# Patient Record
Sex: Female | Born: 1995 | Hispanic: Refuse to answer | Marital: Single | Smoking: Never smoker
Health system: Southern US, Community
[De-identification: ages and names within clinical notes are randomized; demographics above are authoritative.]

## PROBLEM LIST (undated history)

## (undated) DIAGNOSIS — F913 Oppositional defiant disorder: Secondary | ICD-10-CM

## (undated) DIAGNOSIS — F429 Obsessive-compulsive disorder, unspecified: Secondary | ICD-10-CM

## (undated) DIAGNOSIS — H9192 Unspecified hearing loss, left ear: Secondary | ICD-10-CM

## (undated) DIAGNOSIS — F319 Bipolar disorder, unspecified: Secondary | ICD-10-CM

## (undated) DIAGNOSIS — F23 Brief psychotic disorder: Secondary | ICD-10-CM

## (undated) HISTORY — PX: INNER EAR SURGERY: SHX679

## (undated) HISTORY — PX: TONSILLECTOMY AND ADENOIDECTOMY: SUR1326

---
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
## 2018-04-18 ENCOUNTER — Other Ambulatory Visit: Payer: Self-pay

## 2018-04-18 ENCOUNTER — Ambulatory Visit
Admission: EM | Admit: 2018-04-18 | Discharge: 2018-04-18 | Disposition: A | Discharge status: Home / Self Care | Payer: BLUE CROSS/BLUE SHIELD | Attending: Family Medicine | Admitting: Family Medicine

## 2018-04-18 ENCOUNTER — Encounter: Payer: Self-pay | Admitting: Emergency Medicine

## 2018-04-18 DIAGNOSIS — S81811A Laceration without foreign body, right lower leg, initial encounter: Secondary | ICD-10-CM | POA: Diagnosis not present

## 2018-04-18 DIAGNOSIS — W0110XA Fall on same level from slipping, tripping and stumbling with subsequent striking against unspecified object, initial encounter: Secondary | ICD-10-CM | POA: Diagnosis not present

## 2018-04-18 DIAGNOSIS — L03116 Cellulitis of left lower limb: Secondary | ICD-10-CM | POA: Diagnosis not present

## 2018-04-18 DIAGNOSIS — Z23 Encounter for immunization: Secondary | ICD-10-CM | POA: Diagnosis not present

## 2018-04-18 MED ORDER — CEPHALEXIN 500 MG PO CAPS: 500.0000 mg | ORAL_CAPSULE | Freq: Four times a day (QID) | ORAL | 0 refills | Status: AC

## 2018-04-18 MED ORDER — TETANUS-DIPHTH-ACELL PERTUSSIS 5-2.5-18.5 LF-MCG/0.5 IM SUSP
0.5000 mL | Freq: Once | INTRAMUSCULAR | Status: AC
  Administered 2018-04-18: 16:00:00 0.5 mL via INTRAMUSCULAR

## 2018-04-18 NOTE — Discharge Instructions (Signed)
-  Take antibiotics as prescribed. -If redness worsens return to Urgent Care or proceed to ER.

## 2018-04-18 NOTE — ED Provider Notes (Signed)
[facility]    CSN: [id number] Arrival date & time: 04/18/18  1542     History   Chief Complaint Chief Complaint  Patient presents with  . Puncture Wound  . Cellulitis   HPI Melissa Burns is a 23 y.o. female who presents today for evaluation of right leg swelling and redness.  The patient states that on Tuesday she was walking her dog in the woods when she tripped and fell.  She reports cutting her right leg just proximal to her knee, she is not sure if the skin was punctured, she is not sure of any foreign bodies.  She cannot recall if it was wood or a piece of glass that cut the leg.  She reported mild discomfort however over the next 24 hours she noticed increased redness.  She has not noticed any purulent drainage.  She has not had any fevers at home.  She denies any redness extending down distal in the left leg.  Denies any drug allergies.  HPI  Past Medical History:  Diagnosis Date  . Bipolar 1 disorder (HCC)   . Deafness in left ear   . OCD (obsessive compulsive disorder)   . ODD (oppositional defiant disorder)   . Psychotic episode [facility])     Patient Active Problem List   Diagnosis Date Noted  . Suicide attempt (HCC) 05/25/2014  . Overdose 05/23/2014  . Cluster B personality disorder (HCC) 05/23/2014  . Grief 05/23/2014    Past Surgical History:  Procedure Laterality Date  . INNER EAR SURGERY Left   . TONSILLECTOMY AND ADENOIDECTOMY      OB History   No obstetric history on file.    Home Medications    Prior to Admission medications   Medication Sig Start Date End Date Taking? Authorizing Provider  FLUoxetine (PROZAC) 20 MG tablet Take 60 mg by mouth daily.   Yes [provider]  methylphenidate (RITALIN) 20 MG tablet Take 20 mg by mouth 2 (two) times daily.   Yes [provider]  SPRINTEC 28 0.25-35 MG-MCG tablet Take 1 tablet by mouth daily. 04/08/18  Yes [provider]  cephALEXin (KEFLEX) 500 MG capsule Take  1 capsule (500 mg total) by mouth 4 (four) times daily for 7 days. 04/18/18 04/25/18  Tommie Sams, DO  loratadine (CLARITIN) 10 MG tablet Take 1 tablet (10 mg total) by mouth daily. 10/06/14   Renford Dills, NP  nitrofurantoin, macrocrystal-monohydrate, (MACROBID) 100 MG capsule Take 1 capsule (100 mg total) by mouth 2 (two) times daily. 08/03/14   Betancourt, Jarold Song, NP  ondansetron (ZOFRAN ODT) 4 MG disintegrating tablet Take 1 tablet (4 mg total) by mouth every 8 (eight) hours as needed for nausea or vomiting. 12/22/14   Renford Dills, NP  phenazopyridine (PYRIDIUM) 200 MG tablet Take 1 tablet (200 mg total) by mouth 3 (three) times daily. 08/03/14   Betancourt, Jarold Song, NP    Family History Family History  Problem Relation Age of Onset  . Diabetes Maternal Grandfather   . Suicidality Paternal Uncle     Social History Social History   Tobacco Use  . Smoking status: Never Smoker  . Smokeless tobacco: Never Used  Substance Use Topics  . Alcohol use: No    Alcohol/week: 0.0 standard drinks  . Drug use: No    Comment: denied by pt's mother     Allergies   Patient has no known allergies.   Review of Systems Review of Systems  Constitutional: Negative  for fever.  Skin: Positive for color change and wound.  All other systems reviewed and are negative.  Physical Exam Triage Vital Signs ED Triage Vitals  Enc Vitals Group     BP 04/18/18 1555 101/69     Pulse Rate 04/18/18 1555 99     Resp 04/18/18 1555 16     Temp 04/18/18 1555 98.4 F (36.9 C)     Temp Source 04/18/18 1555 Oral     SpO2 04/18/18 1555 98 %     Weight 04/18/18 1552 150 lb (68 kg)     Height 04/18/18 1552  (1.575 m)     Head Circumference --      Peak Flow --      Pain Score 04/18/18 1552 8     Pain Loc --      Pain Edu? --      Excl. in GC? --    No data found.  Updated Vital Signs BP 101/69 (BP Location: Left Arm)   Pulse 99   Temp 98.4 F (36.9 C) (Oral)   Resp 16   Ht  (1.575 m)    Wt 150 lb (68 kg)   LMP 03/28/2018 (Approximate)   SpO2 98%   BMI 27.44 kg/m   Visual Acuity Right Eye Distance:   Left Eye Distance:   Bilateral Distance:    Right Eye Near:   Left Eye Near:    Bilateral Near:     Physical Exam Examination of the left leg demonstrates a approximately 1 cm laceration with good granulation tissue along the lateral aspect of the distal thigh proximal to the knee.  Surrounding this area is a 3 inch x 2 inch circumferential area of skin induration and erythema.  No purulent drainage.  Palpation of the leg does not demonstrate any abscess formation.  No foreign body can be palpated.  She is able to fully extend the left knee and flex left knee without any discomfort.  No evidence of septic arthritis.  Intact to light touch of the left lower extremity.  UC Treatments / Results  Labs (all labs ordered are listed, but only abnormal results are displayed) Labs Reviewed - No data to display  EKG None  Radiology No results found.  Procedures Procedures (including critical care time)  Medications Ordered in UC Medications  Tdap (BOOSTRIX) injection 0.5 mL (0.5 mLs Intramuscular Given 04/18/18 1603)    Initial Impression / Assessment and Plan / UC Course  I have reviewed the triage vital signs and the nursing notes.  Pertinent labs & imaging results that were available during my care of the patient were reviewed by me and considered in my medical decision making (see chart for details).     1.  Treatment options were discussed today with the patient. 2.  Skin examination left leg indicative of nonpurulent cellulitis. 3.  The patient was started on a seven-day course of Keflex.  A marker outline the area of erythema, she was instructed to monitor for decrease in size. 4.  If symptoms worsen or redness increases follow-up with urgent care. Final Clinical Impressions(s) / UC Diagnoses   Final diagnoses:  Cellulitis of left lower extremity      Discharge Instructions     -Take antibiotics as prescribed. -If redness worsens return to Urgent Care or proceed to ER.    ED Prescriptions    Medication Sig Dispense Auth. Provider   cephALEXin (KEFLEX) 500 MG capsule Take 1 capsule (500 mg total)  by mouth 4 (four) times daily for 7 days. 28 capsule Tommie Sams, DO     Controlled Substance Prescriptions [place] Controlled Substance Registry consulted? Not Applicable   Anson [place], PA-C 04/18/18 1621

## 2018-04-18 NOTE — ED Triage Notes (Signed)
Patient states that she was out in the woods on Tuesday and something punctured her left thigh.  Patient c/o redness, swelling and pain at the site.

## 2018-10-15 ENCOUNTER — Emergency Department: Payer: BC Managed Care – PPO

## 2018-10-15 ENCOUNTER — Other Ambulatory Visit: Payer: Self-pay

## 2018-10-15 ENCOUNTER — Emergency Department
Admission: EM | Admit: 2018-10-15 | Discharge: 2018-10-15 | Disposition: A | Discharge status: Home / Self Care | Payer: BC Managed Care – PPO | Attending: Emergency Medicine | Admitting: Emergency Medicine

## 2018-10-15 DIAGNOSIS — Z20828 Contact with and (suspected) exposure to other viral communicable diseases: Secondary | ICD-10-CM | POA: Insufficient documentation

## 2018-10-15 DIAGNOSIS — Z79899 Other long term (current) drug therapy: Secondary | ICD-10-CM | POA: Insufficient documentation

## 2018-10-15 DIAGNOSIS — K529 Noninfective gastroenteritis and colitis, unspecified: Secondary | ICD-10-CM | POA: Diagnosis not present

## 2018-10-15 DIAGNOSIS — I959 Hypotension, unspecified: Secondary | ICD-10-CM | POA: Diagnosis not present

## 2018-10-15 DIAGNOSIS — R55 Syncope and collapse: Secondary | ICD-10-CM | POA: Insufficient documentation

## 2018-10-15 DIAGNOSIS — R05 Cough: Secondary | ICD-10-CM | POA: Diagnosis not present

## 2018-10-15 DIAGNOSIS — J029 Acute pharyngitis, unspecified: Secondary | ICD-10-CM | POA: Diagnosis not present

## 2018-10-15 DIAGNOSIS — R0602 Shortness of breath: Secondary | ICD-10-CM | POA: Diagnosis not present

## 2018-10-15 DIAGNOSIS — R079 Chest pain, unspecified: Secondary | ICD-10-CM | POA: Diagnosis not present

## 2018-10-15 DIAGNOSIS — R112 Nausea with vomiting, unspecified: Secondary | ICD-10-CM | POA: Diagnosis present

## 2018-10-15 LAB — ETHANOL: Alcohol, Ethyl (B): <10 mg/dL (ref <10)

## 2018-10-15 LAB — COMPREHENSIVE METABOLIC PANEL
ALT: 16 U/L (ref 0–44)
AST: 19 U/L (ref 15–41)
Albumin: 4 g/dL (ref 3.5–5.0)
Alkaline Phosphatase: 64 U/L (ref 38–126)
Anion gap: 12 (ref 5–15)
BUN: 10 mg/dL (ref 6–20)
CO2: 25 mmol/L (ref 22–32)
Calcium: 8.9 mg/dL (ref 8.9–10.3)
Chloride: 103 mmol/L (ref 98–111)
Creatinine, Ser: 0.57 mg/dL (ref 0.44–1.00)
GFR calc Af Amer: >60 mL/min (ref >60)
GFR calc non Af Amer: >60 mL/min (ref >60)
Glucose, Bld: 186 mg/dL — ABNORMAL HIGH (ref 70–99)
Potassium: 3.8 mmol/L (ref 3.5–5.1)
Sodium: 140 mmol/L (ref 135–145)
Total Bilirubin: 0.9 mg/dL (ref 0.3–1.2)
Total Protein: 6.7 g/dL (ref 6.5–8.1)

## 2018-10-15 LAB — URINALYSIS, COMPLETE (UACMP) WITH MICROSCOPIC
Bacteria, UA: NONE SEEN
Bilirubin Urine: NEGATIVE
Glucose, UA: 50 mg/dL — AB
Hgb urine dipstick: NEGATIVE
Ketones, ur: NEGATIVE mg/dL
Leukocytes,Ua: NEGATIVE
Nitrite: NEGATIVE
Protein, ur: NEGATIVE mg/dL
Specific Gravity, Urine: 1.013 (ref 1.005–1.030)
pH: 8 (ref 5.0–8.0)

## 2018-10-15 LAB — CBC WITH DIFFERENTIAL/PLATELET
Abs Immature Granulocytes: 0.06 10*3/uL (ref 0.00–0.07)
Basophils Absolute: 0 10*3/uL (ref 0.0–0.1)
Basophils Relative: 0 %
Eosinophils Absolute: 0 10*3/uL (ref 0.0–0.5)
Eosinophils Relative: 0 %
HCT: 38.3 % (ref 36.0–46.0)
Hemoglobin: 13.2 g/dL (ref 12.0–15.0)
Immature Granulocytes: 0 %
Lymphocytes Relative: 5 %
Lymphs Abs: 0.9 10*3/uL (ref 0.7–4.0)
MCH: 31.1 pg (ref 26.0–34.0)
MCHC: 34.5 g/dL (ref 30.0–36.0)
MCV: 90.1 fL (ref 80.0–100.0)
Monocytes Absolute: 0.2 10*3/uL (ref 0.1–1.0)
Monocytes Relative: 1 %
Neutro Abs: 16 10*3/uL — ABNORMAL HIGH (ref 1.7–7.7)
Neutrophils Relative %: 94 %
Platelets: 288 10*3/uL (ref 150–400)
RBC: 4.25 MIL/uL (ref 3.87–5.11)
RDW: 12.1 % (ref 11.5–15.5)
WBC: 17.1 10*3/uL — ABNORMAL HIGH (ref 4.0–10.5)
nRBC: 0 % (ref 0.0–0.2)

## 2018-10-15 LAB — SARS CORONAVIRUS 2 BY RT PCR (HOSPITAL ORDER, PERFORMED IN ~~LOC~~ HOSPITAL LAB): SARS Coronavirus 2: NEGATIVE

## 2018-10-15 LAB — PREGNANCY, URINE: Preg Test, Ur: NEGATIVE

## 2018-10-15 LAB — LIPASE, BLOOD: Lipase: 15 U/L (ref 11–51)

## 2018-10-15 MED ORDER — PROMETHAZINE HCL 25 MG/ML IJ SOLN
25.0000 mg | Freq: Once | INTRAMUSCULAR | Status: AC
  Administered 2018-10-15: 25 mg via INTRAVENOUS
  Filled 2018-10-15: qty 1

## 2018-10-15 MED ORDER — SODIUM CHLORIDE 0.9 % IV BOLUS
1000.0000 mL | Freq: Once | INTRAVENOUS | Status: AC
  Administered 2018-10-15: 07:00:00 1000 mL via INTRAVENOUS

## 2018-10-15 MED ORDER — ONDANSETRON HCL 4 MG/2ML IJ SOLN
4.0000 mg | Freq: Once | INTRAMUSCULAR | Status: AC
  Administered 2018-10-15: 4 mg via INTRAVENOUS
  Filled 2018-10-15: qty 2

## 2018-10-15 MED ORDER — SODIUM CHLORIDE 0.9 % IV BOLUS
1000.0000 mL | Freq: Once | INTRAVENOUS | Status: AC
  Administered 2018-10-15: 1000 mL via INTRAVENOUS

## 2018-10-15 MED ORDER — ONDANSETRON 4 MG PO TBDP: 4.0000 mg | ORAL_TABLET | Freq: Three times a day (TID) | ORAL | 0 refills | Status: DC | PRN

## 2018-10-15 MED ORDER — KETOROLAC TROMETHAMINE 30 MG/ML IJ SOLN
30.0000 mg | Freq: Once | INTRAMUSCULAR | Status: AC
  Administered 2018-10-15: 08:00:00 30 mg via INTRAVENOUS
  Filled 2018-10-15: qty 1

## 2018-10-15 MED ORDER — ALUM & MAG HYDROXIDE-SIMETH 200-200-20 MG/5ML PO SUSP
30.0000 mL | Freq: Once | ORAL | Status: AC
  Administered 2018-10-15: 08:00:00 30 mL via ORAL
  Filled 2018-10-15: qty 30

## 2018-10-15 NOTE — ED Notes (Signed)
Pt's IV site noted to be positional, spoke with patient regarding changing IV site to allow for easier fluid administration, pt in agreement to change IV site.

## 2018-10-15 NOTE — ED Notes (Signed)
MD made aware pt's BP down to 79 systolic. Pt repositioned in bed and BP retook, BP 84 systolic. Fluids placed on a pump, pt instructed to continue to lie on her back. Pt asymptomatic at this time. Will continue to monitor. MD aware of patient's drop in BP.

## 2018-10-15 NOTE — ED Notes (Addendum)
Pt noted to become hypotensive again, pt repositioned on her back, BP normotensive after repositioning. MD aware.

## 2018-10-15 NOTE — ED Notes (Signed)
Pt vomited 330ml with a yellow appearance. Pt is now resting comfortably and denies any further needs at this time. MD made aware. Will continue to monitor.

## 2018-10-15 NOTE — ED Provider Notes (Signed)
[facility] Emergency Department Provider Note  Time seen: 7:23 AM  I have reviewed the triage vital signs and the nursing notes.   HISTORY  Chief Complaint Weakness and Emesis   HPI Melissa Burns is a 23 y.o. female with a past medical history of bipolar, presents to the emergency department for nausea vomiting and diarrhea.  According to the patient around 1:00 this morning she awoke with nausea vomiting and diarrhea.  States she has been unable to keep down anything since 1:00 this morning.  Patient is complaining of mild chest discomfort as well described as a burning sensation which started after the vomiting.  Patient states slight cough but only when vomiting.  Does not know if she is had a fever.  Patient states diffuse mild abdominal pain but denies any focal abdominal pain.   Past Medical History:  Diagnosis Date  . Bipolar 1 disorder (HCC)   . Deafness in left ear   . OCD (obsessive compulsive disorder)   . ODD (oppositional defiant disorder)   . Psychotic episode [facility])     Patient Active Problem List   Diagnosis Date Noted  . Suicide attempt (HCC) 05/25/2014  . Overdose 05/23/2014  . Cluster B personality disorder (HCC) 05/23/2014  . Grief 05/23/2014    Past Surgical History:  Procedure Laterality Date  . INNER EAR SURGERY Left   . TONSILLECTOMY AND ADENOIDECTOMY      Prior to Admission medications   Medication Sig Start Date End Date Taking? Authorizing Provider  FLUoxetine (PROZAC) 20 MG tablet Take 60 mg by mouth daily.    [provider]  loratadine (CLARITIN) 10 MG tablet Take 1 tablet (10 mg total) by mouth daily. 10/06/14   Renford Dills, NP  methylphenidate (RITALIN) 20 MG tablet Take 20 mg by mouth 2 (two) times daily.    [provider]  nitrofurantoin, macrocrystal-monohydrate, (MACROBID) 100 MG capsule Take 1 capsule (100 mg total) by mouth 2 (two) times daily. 08/03/14   Betancourt, Jarold Song, NP   ondansetron (ZOFRAN ODT) 4 MG disintegrating tablet Take 1 tablet (4 mg total) by mouth every 8 (eight) hours as needed for nausea or vomiting. 12/22/14   Renford Dills, NP  phenazopyridine (PYRIDIUM) 200 MG tablet Take 1 tablet (200 mg total) by mouth 3 (three) times daily. 08/03/14   Betancourt, Jarold Song, NP  SPRINTEC 28 0.25-35 MG-MCG tablet Take 1 tablet by mouth daily. 04/08/18   [provider]    No Known Allergies  Family History  Problem Relation Age of Onset  . Diabetes Maternal Grandfather   . Suicidality Paternal Uncle     Social History Social History   Tobacco Use  . Smoking status: Never Smoker  . Smokeless tobacco: Never Used  Substance Use Topics  . Alcohol use: No    Alcohol/week: 0.0 standard drinks  . Drug use: No    Comment: denied by pt's mother    Review of Systems Constitutional: No known fever per patient. ENT: Negative for recent illness/congestion Cardiovascular: Mild chest burning. Respiratory: Negative for shortness of breath.  Occasional cough but only when vomiting. Gastrointestinal: Mild diffuse abdominal pain/cramping.  Positive for nausea vomiting diarrhea. Genitourinary: Negative for urinary compaints Musculoskeletal: Negative for musculoskeletal complaints Neurological: Negative for headache All other ROS negative  ____________________________________________   PHYSICAL EXAM:  VITAL SIGNS: ED Triage Vitals  Enc Vitals Group     BP 10/15/18 0627 (!) 114/54     Pulse Rate 10/15/18 0627 Marland Kitchen)  52     Resp 10/15/18 0627 20     Temp 10/15/18 0640 (!) 97.1 F (36.2 C)     Temp Source 10/15/18 0640 Oral     SpO2 10/15/18 0627 97 %     Weight 10/15/18 0629 145 lb (65.8 kg)     Height 10/15/18 0629 5\' 1"  (1.549 m)     Head Circumference --      Peak Flow --      Pain Score 10/15/18 0629 8     Pain Loc --      Pain Edu? --      Excl. in GC? --    Constitutional: Alert and oriented. Well appearing and in no distress. Eyes:  Normal exam ENT      Head: Normocephalic and atraumatic.      Mouth/Throat: Mucous membranes are moist. Cardiovascular: Normal rate, regular rhythm.  Respiratory: Normal respiratory effort without tachypnea nor retractions. Breath sounds are clear Gastrointestinal: Soft, mild diffuse tenderness without focal tenderness identified or significant tenderness identified.  No rebound guarding or distention. Musculoskeletal: Nontender with normal range of motion in all extremities.  Neurologic:  Normal speech and language. No gross focal neurologic deficits  Skin:  Skin is warm, dry and intact.  Psychiatric: Mood and affect are normal.   ____________________________________________    EKG  EKG viewed and interpreted by myself shows sinus arrhythmia at 63 bpm with a narrow QRS, normal axis, normal intervals, no concerning ST changes.  ____________________________________________   INITIAL IMPRESSION / ASSESSMENT AND PLAN / ED COURSE  Pertinent labs & imaging results that were available during my care of the patient were reviewed by me and considered in my medical decision making (see chart for details).   Patient presents to the emergency department for nausea vomiting diarrhea since 1:00 this morning.  Differential would include gastroenteritis, gastritis, pancreatitis, gallbladder pathology, intra-abdominal infection.  We will check labs, urinalysis and continue to closely monitor.  We will IV hydrate and treat nausea.  I did discuss CT imaging, with the patient and mother, after conversation we have decided to hold off on CT at this time.  Symptoms are very suggestive of gastroenteritis.  Patient's work-up shows a leukocytosis otherwise largely nonrevealing.  Patient is feeling much better after medications and fluids.  We will attempt to p.o. challenge in the emergency department.  Patient agreeable to plan of care.  Patient is feeling much better.  Patient's blood pressure cuff is reading  in the 80s I readjusted it as it was quite loose.  After readjustment blood pressure reading 103 systolic.  Patient states she feels much better.  Patient has been drinking apple juice without issue.  I reevaluated the patient's abdomen she has a completely benign abdominal exam currently.  I discussed return precautions for being unable to keep down fluids or for return of abdominal pain.  Patient agreeable to plan of care.   Irene LimboLucianna T Bumgardner was evaluated in Emergency Department on 10/15/2018 for the symptoms described in the history of present illness. She was evaluated in the context of the global COVID-19 pandemic, which necessitated consideration that the patient might be at risk for infection with the SARS-CoV-2 virus that causes COVID-19. Institutional protocols and algorithms that pertain to the evaluation of patients at risk for COVID-19 are in a state of rapid change based on information released by regulatory bodies including the CDC and federal and state organizations. These policies and algorithms were followed during the patient's care in the  ED.  ____________________________________________   FINAL CLINICAL IMPRESSION(S) / ED DIAGNOSES  Gastroenteritis   Harvest Dark, MD 10/15/18 1129

## 2018-10-15 NOTE — ED Notes (Signed)
Pt heard wreching from nursing station. This RN introduced self to pt. Pt given nausea and pain medication. Pt able to ingest 3/4 of the Maalox medication. Pt's mother is at bedside. Pt denies any further needs at this time. Will continue to monitor.

## 2018-10-15 NOTE — ED Notes (Signed)
Pt assisted to bathroom unable to void at the time. Hat placed in toilet. Warm blanket given.

## 2018-10-15 NOTE — ED Notes (Signed)
Patient had syncopal episode while being triaged, taken to room 7. Patient regained consciousness upon arrival to room.

## 2018-10-15 NOTE — ED Triage Notes (Addendum)
Patient reports vomiting since 0130 this am. Patient states she also feels weak and short of breath. Patient able to speak in complete sentences without difficulty. Actively vomiting in triage. Patient reports it is painful to swallow, believes she had fever earlier today. + Diarrhea.

## 2018-11-12 ENCOUNTER — Ambulatory Visit
Admission: EM | Admit: 2018-11-12 | Discharge: 2018-11-12 | Disposition: A | Discharge status: Home / Self Care | Payer: BC Managed Care – PPO | Attending: Family Medicine | Admitting: Family Medicine

## 2018-11-12 ENCOUNTER — Other Ambulatory Visit: Payer: Self-pay

## 2018-11-12 ENCOUNTER — Encounter: Payer: Self-pay | Admitting: Emergency Medicine

## 2018-11-12 DIAGNOSIS — R300 Dysuria: Secondary | ICD-10-CM

## 2018-11-12 DIAGNOSIS — R05 Cough: Secondary | ICD-10-CM

## 2018-11-12 DIAGNOSIS — R0981 Nasal congestion: Secondary | ICD-10-CM

## 2018-11-12 DIAGNOSIS — J069 Acute upper respiratory infection, unspecified: Secondary | ICD-10-CM

## 2018-11-12 DIAGNOSIS — Z3202 Encounter for pregnancy test, result negative: Secondary | ICD-10-CM

## 2018-11-12 DIAGNOSIS — Z7189 Other specified counseling: Secondary | ICD-10-CM

## 2018-11-12 LAB — URINALYSIS, COMPLETE (UACMP) WITH MICROSCOPIC
Bilirubin Urine: NEGATIVE
Glucose, UA: NEGATIVE mg/dL
Ketones, ur: NEGATIVE mg/dL
Nitrite: NEGATIVE
Protein, ur: 30 mg/dL — AB
Specific Gravity, Urine: 1.02 (ref 1.005–1.030)
WBC, UA: >50 WBC/hpf (ref 0–5)
pH: 8.5 — ABNORMAL HIGH (ref 5.0–8.0)

## 2018-11-12 LAB — PREGNANCY, URINE: Preg Test, Ur: NEGATIVE

## 2018-11-12 MED ORDER — CEPHALEXIN 500 MG PO CAPS: 500.0000 mg | ORAL_CAPSULE | Freq: Two times a day (BID) | ORAL | 0 refills | Status: AC

## 2018-11-12 NOTE — ED Provider Notes (Signed)
[facility] ____________________________________________  Time seen: Approximately 9:58 AM  I have reviewed the triage vital signs and the nursing notes.   HISTORY  Chief Complaint Dysuria and Cough  HPI Melissa Burns is a 23 y.o. female presenting for evaluation of 10 days of runny nose, nasal congestion and occasional coughing.  No accompanying fevers.  Denies current sore throat.  States her nasal congestion and drainage symptoms are getting much better.  States no sinus pain.  States coming in for this complaint as she was just informed yesterday that she had a coworker that tested positive for COVID-19 and she wanted to be tested.  Patient also reports for the last 2 to 3 days she has had urinary frequency, urinary urgency, some discomfort and low back discomfort.  States this feels similar to previous UTIs.  Denies vaginal complaints.  States she does not think she is pregnant.  Continues to eat and drink well.  Denies aggravating alleviating factors otherwise.  Reports otherwise doing well.  Patient's last menstrual period was 10/29/2018.    Past Medical History:  Diagnosis Date  . Bipolar 1 disorder ([place])   . Deafness in left ear   . OCD (obsessive compulsive disorder)   . ODD (oppositional defiant disorder)   . Psychotic episode [facility])     Patient Active Problem List   Diagnosis Date Noted  . Suicide attempt ([place]) 05/25/2014  . Overdose 05/23/2014  . Cluster B personality disorder ([place]) 05/23/2014  . Grief 05/23/2014    Past Surgical History:  Procedure Laterality Date  . INNER EAR SURGERY Left   . TONSILLECTOMY AND ADENOIDECTOMY       No current facility-administered medications for this encounter.   Current Outpatient Medications:  .  FLUoxetine (PROZAC) 40 MG capsule, Take 40 mg by mouth daily. , Disp: , Rfl:  .  methylphenidate (RITALIN) 20 MG tablet, Take 20 mg by mouth daily. , Disp: , Rfl:  .  cephALEXin (KEFLEX) 500 MG capsule, Take  1 capsule (500 mg total) by mouth 2 (two) times daily for 7 days., Disp: 14 capsule, Rfl: 0  Allergies Patient has no known allergies.  Family History  Problem Relation Age of Onset  . Diabetes Maternal Grandfather   . Suicidality Paternal Uncle     Social History Social History   Tobacco Use  . Smoking status: Never Smoker  . Smokeless tobacco: Never Used  Substance Use Topics  . Alcohol use: Not Currently    Alcohol/week: 0.0 standard drinks  . Drug use: No    Comment: denied by pt's mother    Review of Systems Constitutional: No fever ENT: No sore throat.  Positive congestion. Denies changes in taste or smell. Cardiovascular: Denies chest pain. Respiratory: Denies shortness of breath. Gastrointestinal: No abdominal pain.  No nausea, no vomiting.  No diarrhea.   Genitourinary: Positive for dysuria. Musculoskeletal: Negative for back pain. Skin: Negative for rash.  ____________________________________________   PHYSICAL EXAM:  VITAL SIGNS: ED Triage Vitals  Enc Vitals Group     BP 11/12/18 0920 103/75     Pulse Rate 11/12/18 0920 98     Resp 11/12/18 0920 18     Temp 11/12/18 0920 98.4 F (36.9 C)     Temp Source 11/12/18 0920 Oral     SpO2 11/12/18 0920 98 %     Weight 11/12/18 0918 145 lb (65.8 kg)     Height 11/12/18 0918 5\' 1"  (1.549 m)     Head Circumference --  Peak Flow --      Pain Score 11/12/18 0918 0     Pain Loc --      Pain Edu? --      Excl. in GC? --     Constitutional: Alert and oriented. Well appearing and in no acute distress. Eyes: Conjunctivae are normal.  Head: Atraumatic. No sinus tenderness to palpation. No swelling. No erythema.  Ears: no erythema, normal TMs bilaterally.   Nose:Nasal congestion  Mouth/Throat: Mucous membranes are moist. No pharyngeal erythema. No tonsillar swelling or exudate.  Neck: No stridor.  No cervical spine tenderness to palpation. Hematological/Lymphatic/Immunilogical: No cervical lymphadenopathy.  Cardiovascular: Normal rate, regular rhythm. Grossly normal heart sounds.  Good peripheral circulation. Respiratory: Normal respiratory effort.  No retractions. No wheezes, rales or rhonchi. Good air movement.  Gastrointestinal: Soft and nontender. No CVA tenderness. Musculoskeletal: Ambulatory with steady gait.  Neurologic:  Normal speech and language. No gait instability. Skin:  Skin appears warm, dry and intact. No rash noted. Psychiatric: Mood and affect are normal. Speech and behavior are normal. ___________________________________________   LABS (all labs ordered are listed, but only abnormal results are displayed)  Labs Reviewed  URINALYSIS, COMPLETE (UACMP) WITH MICROSCOPIC - Abnormal; Notable for the following components:      Result Value   APPearance CLOUDY (*)    pH 8.5 (*)    Hgb urine dipstick TRACE (*)    Protein, ur 30 (*)    Leukocytes,Ua MODERATE (*)    Bacteria, UA RARE (*)    All other components within normal limits  NOVEL CORONAVIRUS, NAA (HOSP ORDER, SEND-OUT TO REF LAB; TAT 18-24 HRS)  URINE CULTURE  PREGNANCY, URINE   ____________________________________________   PROCEDURES Procedures   INITIAL IMPRESSION / ASSESSMENT AND PLAN / ED COURSE  Pertinent labs & imaging results that were available during my care of the patient were reviewed by me and considered in my medical decision making (see chart for details).  Well-appearing patient.  No acute distress.  Suspect recent cold symptoms, patient is improving.  COVID-19 testing completed and advice given.  Urinalysis concerning for UTI, will culture.  Will empirically treat with oral Keflex.  Encourage rest, fluids, supportive care.Discussed indication, risks and benefits of medications with patient.  Discussed follow up with Primary care physician this week as needed. Discussed follow up and return parameters including no resolution or any worsening concerns. Patient verbalized understanding and agreed to  plan.   ____________________________________________   FINAL CLINICAL IMPRESSION(S) / ED DIAGNOSES  Final diagnoses:  Dysuria  Upper respiratory tract infection, unspecified type  Advice given about COVID-19 virus infection     ED Discharge Orders         Ordered    cephALEXin (KEFLEX) 500 MG capsule  2 times daily     11/12/18 1008           Note: This dictation was prepared with Dragon dictation along with smaller phrase technology. Any transcriptional errors that result from this process are unintentional.         Renford Dills, NP 11/12/18 1012

## 2018-11-12 NOTE — Discharge Instructions (Addendum)
Take medication as prescribed. Rest. Drink plenty of fluids.  ° °Follow up with your primary care physician this week as needed. Return to Urgent care for new or worsening concerns.  ° °

## 2018-11-12 NOTE — ED Triage Notes (Signed)
Patient c/o cough, sneezing x 10 days. Denies fever. Patient also reports that she is having dysuria and low back that started 2-3 days ago.

## 2018-11-13 LAB — URINE CULTURE

## 2018-11-13 LAB — NOVEL CORONAVIRUS, NAA (HOSP ORDER, SEND-OUT TO REF LAB; TAT 18-24 HRS): SARS-CoV-2, NAA: NOT DETECTED

## 2018-12-17 ENCOUNTER — Other Ambulatory Visit: Payer: Self-pay

## 2018-12-17 ENCOUNTER — Ambulatory Visit (INDEPENDENT_AMBULATORY_CARE_PROVIDER_SITE_OTHER): Payer: BC Managed Care – PPO | Admitting: Obstetrics and Gynecology

## 2018-12-17 ENCOUNTER — Other Ambulatory Visit (HOSPITAL_COMMUNITY)
Admission: RE | Admit: 2018-12-17 | Discharge: 2018-12-17 | Disposition: A | Discharge status: Home / Self Care | Payer: BC Managed Care – PPO | Source: Ambulatory Visit | Attending: Obstetrics and Gynecology | Admitting: Obstetrics and Gynecology

## 2018-12-17 ENCOUNTER — Encounter: Payer: Self-pay | Admitting: Obstetrics and Gynecology

## 2018-12-17 VITALS — BP 100/64 | HR 100 | Ht 61.0 in | Wt 140.0 lb

## 2018-12-17 DIAGNOSIS — Z01419 Encounter for gynecological examination (general) (routine) without abnormal findings: Secondary | ICD-10-CM

## 2018-12-17 DIAGNOSIS — Z113 Encounter for screening for infections with a predominantly sexual mode of transmission: Secondary | ICD-10-CM | POA: Insufficient documentation

## 2018-12-17 DIAGNOSIS — Z124 Encounter for screening for malignant neoplasm of cervix: Secondary | ICD-10-CM | POA: Diagnosis present

## 2018-12-17 DIAGNOSIS — Z3009 Encounter for other general counseling and advice on contraception: Secondary | ICD-10-CM

## 2018-12-17 DIAGNOSIS — Z1331 Encounter for screening for depression: Secondary | ICD-10-CM

## 2018-12-17 DIAGNOSIS — Z1339 Encounter for screening examination for other mental health and behavioral disorders: Secondary | ICD-10-CM

## 2018-12-17 NOTE — Progress Notes (Signed)
Gynecology Annual Exam  PCP: Patient, No Pcp Per  Chief Complaint  Patient presents with  . Gynecologic Exam    discuss birthcontrol options   History of Present Illness:  Ms. LEASIA SWANN is a 23 y.o. G0 female whose LMP was Patient's last menstrual period was 12/07/2018 (exact date)., presents today for her annual examination.  Her menses are monthly, but she doesn't have consistent timing of her menses.  Her periods are heavy for the 1st couple of days and more "spotty" the last several days. They normally last about 5 days.   She is sexually active. She uses oral contraceptives for birth control.   Last Pap: never had Hx of STDs: none  There is no FH of breast cancer. There is no FH of ovarian cancer. The patient does not do self-breast exams.  Tobacco use: The patient denies current or previous tobacco use. Alcohol use: social drinker Exercise: at work there is constant moving and lifting  The patient wears seatbelts: yes.   The patient reports that domestic violence in her life is absent.    She is currently on OCP (estrogen/progesterone) and desiring to start another form of contraception.  She has a past medical history significant for no contraindication to estrogen.  She specifically denies a history of migraine with aura, chronic hypertension, history of DVT/PE and smoking.  Reported Patient's last menstrual period was 12/07/2018 (exact date)..      Past Medical History:  Diagnosis Date  . Bipolar 1 disorder (HCC)   . Deafness in left ear   . OCD (obsessive compulsive disorder)   . ODD (oppositional defiant disorder)   . Psychotic episode [facility])     Past Surgical History:  Procedure Laterality Date  . INNER EAR SURGERY Left   . TONSILLECTOMY AND ADENOIDECTOMY      Prior to Admission medications   Medication Sig Start Date End Date Taking? Authorizing Provider  FLUoxetine (PROZAC) 40 MG capsule Take 40 mg by mouth daily.    Yes [provider]   methylphenidate (RITALIN) 20 MG tablet Take 20 mg by mouth daily.    Yes [provider]  Combined OCPs  Allergies: No Known Allergies  Obstetric History: G0  Social History   Socioeconomic History  . Marital status: Single    Spouse name: Not on file  . Number of children: Not on file  . Years of education: Not on file  . Highest education level: Not on file  Occupational History  . Not on file  Social Needs  . Financial resource strain: Not on file  . Food insecurity    Worry: Not on file    Inability: Not on file  . Transportation needs    Medical: Not on file    Non-medical: Not on file  Tobacco Use  . Smoking status: Never Smoker  . Smokeless tobacco: Never Used  Substance and Sexual Activity  . Alcohol use: Yes    Alcohol/week: 0.0 standard drinks  . Drug use: No    Comment: denied by pt's mother  . Sexual activity: Yes  Lifestyle  . Physical activity    Days per week: Not on file    Minutes per session: Not on file  . Stress: Not on file  Relationships  . Social Musician on phone: Not on file    Gets together: Not on file    Attends religious service: Not on file    Active member of club or  organization: Not on file    Attends meetings of clubs or organizations: Not on file    Relationship status: Not on file  . Intimate partner violence    Fear of current or ex partner: Not on file    Emotionally abused: Not on file    Physically abused: Not on file    Forced sexual activity: Not on file  Other Topics Concern  . Not on file  Social History Narrative  . Not on file    Family History  Problem Relation Age of Onset  . Diabetes Maternal Grandfather   . Suicidality Paternal Uncle     Review of Systems  Constitutional: Negative.   HENT: Negative.   Eyes: Negative.   Respiratory: Negative.   Cardiovascular: Negative.   Gastrointestinal: Negative.   Genitourinary: Negative.   Musculoskeletal: Negative.   Skin: Negative.    Neurological: Negative.   Psychiatric/Behavioral: Negative.      Physical Exam BP 100/64 (BP Location: Left Arm, Patient Position: Sitting, Cuff Size: Normal)   Pulse 100   Ht 5\' 1"  (1.549 m)   Wt 140 lb (63.5 kg)   LMP 12/07/2018 (Exact Date)   BMI 26.45 kg/m    Physical Exam Constitutional:      General: She is not in acute distress.    Appearance: Normal appearance. She is well-developed.  Genitourinary:     Pelvic exam was performed with patient supine.     Vulva, urethra, bladder and uterus normal.     No inguinal adenopathy present in the right or left side.    No signs of injury in the vagina.     No vaginal discharge, erythema, tenderness or bleeding.     No cervical motion tenderness, discharge, lesion or polyp.     Uterus is mobile.     Uterus is not enlarged or tender.     No uterine mass detected.    Uterus is anteverted.     No right or left adnexal mass present.     Right adnexa not tender or full.     Left adnexa not tender or full.  HENT:     Head: Normocephalic and atraumatic.  Eyes:     General: No scleral icterus.    Conjunctiva/sclera: Conjunctivae normal.  Neck:     Musculoskeletal: Normal range of motion and neck supple.     Thyroid: No thyromegaly.  Cardiovascular:     Rate and Rhythm: Normal rate and regular rhythm.     Heart sounds: No murmur. No friction rub. No gallop.   Pulmonary:     Effort: Pulmonary effort is normal. No respiratory distress.     Breath sounds: Normal breath sounds. No wheezing or rales.  Chest:     Breasts:        Right: No inverted nipple, mass, nipple discharge, skin change or tenderness.        Left: No inverted nipple, mass, nipple discharge, skin change or tenderness.  Abdominal:     General: Bowel sounds are normal. There is no distension.     Palpations: Abdomen is soft. There is no mass.     Tenderness: There is no abdominal tenderness. There is no guarding or rebound.  Musculoskeletal: Normal range of  motion.        General: No swelling or tenderness.  Lymphadenopathy:     Cervical: No cervical adenopathy.     Lower Body: No right inguinal adenopathy. No left inguinal adenopathy.  Neurological:  General: No focal deficit present.     Mental Status: She is alert and oriented to person, place, and time.     Cranial Nerves: No cranial nerve deficit.  Skin:    General: Skin is warm and dry.     Findings: No erythema or rash.  Psychiatric:        Mood and Affect: Mood normal.        Behavior: Behavior normal.        Judgment: Judgment normal.     Female chaperone present for pelvic and breast  portions of the physical exam  Results: AUDIT Questionnaire (screen for alcoholism): 3 PHQ-9: 5   Assessment: 23 y.o. No obstetric history on file. female here for routine annual gynecologic examination  Plan: Problem List Items Addressed This Visit    None    Visit Diagnoses    Women's annual routine gynecological examination    -  Primary   Relevant Orders   Cytology - PAP   Screening for depression       Screening for alcoholism       Pap smear for cervical cancer screening       Relevant Orders   Cytology - PAP   Screen for STD (sexually transmitted disease)       Relevant Orders   Cytology - PAP   Encounter for counseling regarding initiation of other contraceptive measure          Screening: -- Blood pressure screen normal -- Weight screening: normal -- Depression screening negative (PHQ-9) -- Nutrition: normal -- cholesterol screening: not due for screening -- osteoporosis screening: not due -- tobacco screening: not using -- alcohol screening: AUDIT questionnaire indicates low-risk usage. -- family history of breast cancer screening: done. not at high risk. -- no evidence of domestic violence or intimate partner violence. -- STD screening: gonorrhea/chlamydia NAAT collected -- pap smear collected per ASCCP guidelines -- HPV vaccination series: unsure.    Contraceptive counseling: Reviewed all forms of birth control options available including abstinence; over the counter/barrier methods; hormonal contraceptive medication including pill, patch, ring, injection,contraceptive implant; hormonal and nonhormonal IUDs; permanent sterilization options including vasectomy and the various tubal sterilization modalities. Risks and benefits reviewed.  Questions were answered.  For now, she states that she will continue with her combined OCPs and will consider other options.  She appears to be most interested in an intrauterine device   15 minutes spent in face to face discussion with > 50% spent in counseling,management, and coordination of care of her contraceptive counseling in addition to time spent for her routine gynecologic exam.   Thomasene MohairStephen Yelina Sarratt, MD 12/17/2018 4:16 PM

## 2018-12-23 LAB — CYTOLOGY - PAP
Chlamydia: NEGATIVE
Comment: NEGATIVE
Comment: NEGATIVE
Comment: NORMAL
Diagnosis: NEGATIVE
Neisseria Gonorrhea: NEGATIVE
Trichomonas: NEGATIVE

## 2019-01-06 ENCOUNTER — Ambulatory Visit
Admission: EM | Admit: 2019-01-06 | Discharge: 2019-01-06 | Discharge status: Against Medical Advice | Payer: BC Managed Care – PPO

## 2019-04-18 ENCOUNTER — Emergency Department
Admission: EM | Admit: 2019-04-18 | Discharge: 2019-04-19 | Disposition: A | Discharge status: Home / Self Care | Payer: BC Managed Care – PPO | Attending: Emergency Medicine | Admitting: Emergency Medicine

## 2019-04-18 ENCOUNTER — Other Ambulatory Visit: Payer: Self-pay

## 2019-04-18 DIAGNOSIS — Z046 Encounter for general psychiatric examination, requested by authority: Secondary | ICD-10-CM | POA: Diagnosis present

## 2019-04-18 DIAGNOSIS — F4325 Adjustment disorder with mixed disturbance of emotions and conduct: Secondary | ICD-10-CM | POA: Insufficient documentation

## 2019-04-18 DIAGNOSIS — Z79899 Other long term (current) drug therapy: Secondary | ICD-10-CM | POA: Diagnosis not present

## 2019-04-18 DIAGNOSIS — X789XXA Intentional self-harm by unspecified sharp object, initial encounter: Secondary | ICD-10-CM | POA: Insufficient documentation

## 2019-04-18 DIAGNOSIS — F319 Bipolar disorder, unspecified: Secondary | ICD-10-CM | POA: Insufficient documentation

## 2019-04-18 DIAGNOSIS — Z7289 Other problems related to lifestyle: Secondary | ICD-10-CM

## 2019-04-18 LAB — CBC
HCT: 40.7 % (ref 36.0–46.0)
Hemoglobin: 13.6 g/dL (ref 12.0–15.0)
MCH: 30.8 pg (ref 26.0–34.0)
MCHC: 33.4 g/dL (ref 30.0–36.0)
MCV: 92.3 fL (ref 80.0–100.0)
Platelets: 306 10*3/uL (ref 150–400)
RBC: 4.41 MIL/uL (ref 3.87–5.11)
RDW: 12.1 % (ref 11.5–15.5)
WBC: 12.5 10*3/uL — ABNORMAL HIGH (ref 4.0–10.5)
nRBC: 0 % (ref 0.0–0.2)

## 2019-04-18 LAB — URINE DRUG SCREEN, QUALITATIVE (ARMC ONLY)
Amphetamines, Ur Screen: NOT DETECTED
Barbiturates, Ur Screen: NOT DETECTED
Benzodiazepine, Ur Scrn: NOT DETECTED
Cannabinoid 50 Ng, Ur ~~LOC~~: POSITIVE — AB
Cocaine Metabolite,Ur ~~LOC~~: NOT DETECTED
MDMA (Ecstasy)Ur Screen: NOT DETECTED
Methadone Scn, Ur: NOT DETECTED
Opiate, Ur Screen: NOT DETECTED
Phencyclidine (PCP) Ur S: NOT DETECTED
Tricyclic, Ur Screen: NOT DETECTED

## 2019-04-18 LAB — COMPREHENSIVE METABOLIC PANEL
ALT: 13 U/L (ref 0–44)
AST: 13 U/L — ABNORMAL LOW (ref 15–41)
Albumin: 4.1 g/dL (ref 3.5–5.0)
Alkaline Phosphatase: 64 U/L (ref 38–126)
Anion gap: 7 (ref 5–15)
BUN: 9 mg/dL (ref 6–20)
CO2: 24 mmol/L (ref 22–32)
Calcium: 9.3 mg/dL (ref 8.9–10.3)
Chloride: 108 mmol/L (ref 98–111)
Creatinine, Ser: 0.55 mg/dL (ref 0.44–1.00)
GFR calc Af Amer: >60 mL/min (ref >60)
GFR calc non Af Amer: >60 mL/min (ref >60)
Glucose, Bld: 95 mg/dL (ref 70–99)
Potassium: 4 mmol/L (ref 3.5–5.1)
Sodium: 139 mmol/L (ref 135–145)
Total Bilirubin: 0.7 mg/dL (ref 0.3–1.2)
Total Protein: 7.2 g/dL (ref 6.5–8.1)

## 2019-04-18 LAB — ETHANOL: Alcohol, Ethyl (B): <10 mg/dL (ref <10)

## 2019-04-18 LAB — SALICYLATE LEVEL: Salicylate Lvl: <7 mg/dL — ABNORMAL LOW (ref 7.0–30.0)

## 2019-04-18 LAB — POCT PREGNANCY, URINE: Preg Test, Ur: NEGATIVE

## 2019-04-18 LAB — ACETAMINOPHEN LEVEL: Acetaminophen (Tylenol), Serum: <10 ug/mL — ABNORMAL LOW (ref 10–30)

## 2019-04-18 NOTE — ED Triage Notes (Signed)
Pt states she cut herself this pm around 1700. Pt states it is her way of dealing with stress. Pt with superficial lacerations with controlled bleeding noted to left anterior forearm. Pt denies SI. [place] police brought pt to ED after someone called them for assist.

## 2019-04-18 NOTE — ED Notes (Signed)
Report given to [facility] Dr Barry Dienes. Camera placed in room.
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
## 2019-04-19 NOTE — ED Provider Notes (Addendum)
Emergency Medicine Observation Re-evaluation Note  Melissa Burns is a 24 y.o. female, seen on rounds today.  Pt initially presented to the ED for complaints of Psychiatric Evaluation Currently, the patient is calm and cooperative.Marland Kitchen  Physical Exam  BP 98/76   Pulse (!) 103   Temp 98.3 F (36.8 C) (Oral)   Resp 16   Ht 5\' 1"  (1.549 m)   Wt 61.2 kg   LMP 03/28/2019   SpO2 97%   BMI 25.51 kg/m  Physical Exam Nontoxic, sitting upright. ED Course / MDM  EKG:    I have reviewed the labs performed to date as well as medications administered while in observation.  Recent changes in the last 24 hours include [facility] psychiatry consult completed which finds patient to be psychiatrically stable and safe and appropriate for discharge home with support.  She will continue her outpatient follow-up with psychiatry and therapy, and suicide precautions have been discussed with her with the telepsychiatry consultant.. Plan  Current plan is for discharge home. Patient is not under full IVC at this time.   [facility], MD 04/19/19 0130    06/19/19, MD 04/19/19 [phone]

## 2019-05-16 DEATH — deceased

## 2021-05-02 IMAGING — DX DG CHEST 1V PORT
1 series · 1 of 1 positions shown · non-contrast
Comparison: None.

CLINICAL DATA: Chest pain, vomiting

EXAM:
PORTABLE CHEST 1 VIEW

[chest ap]
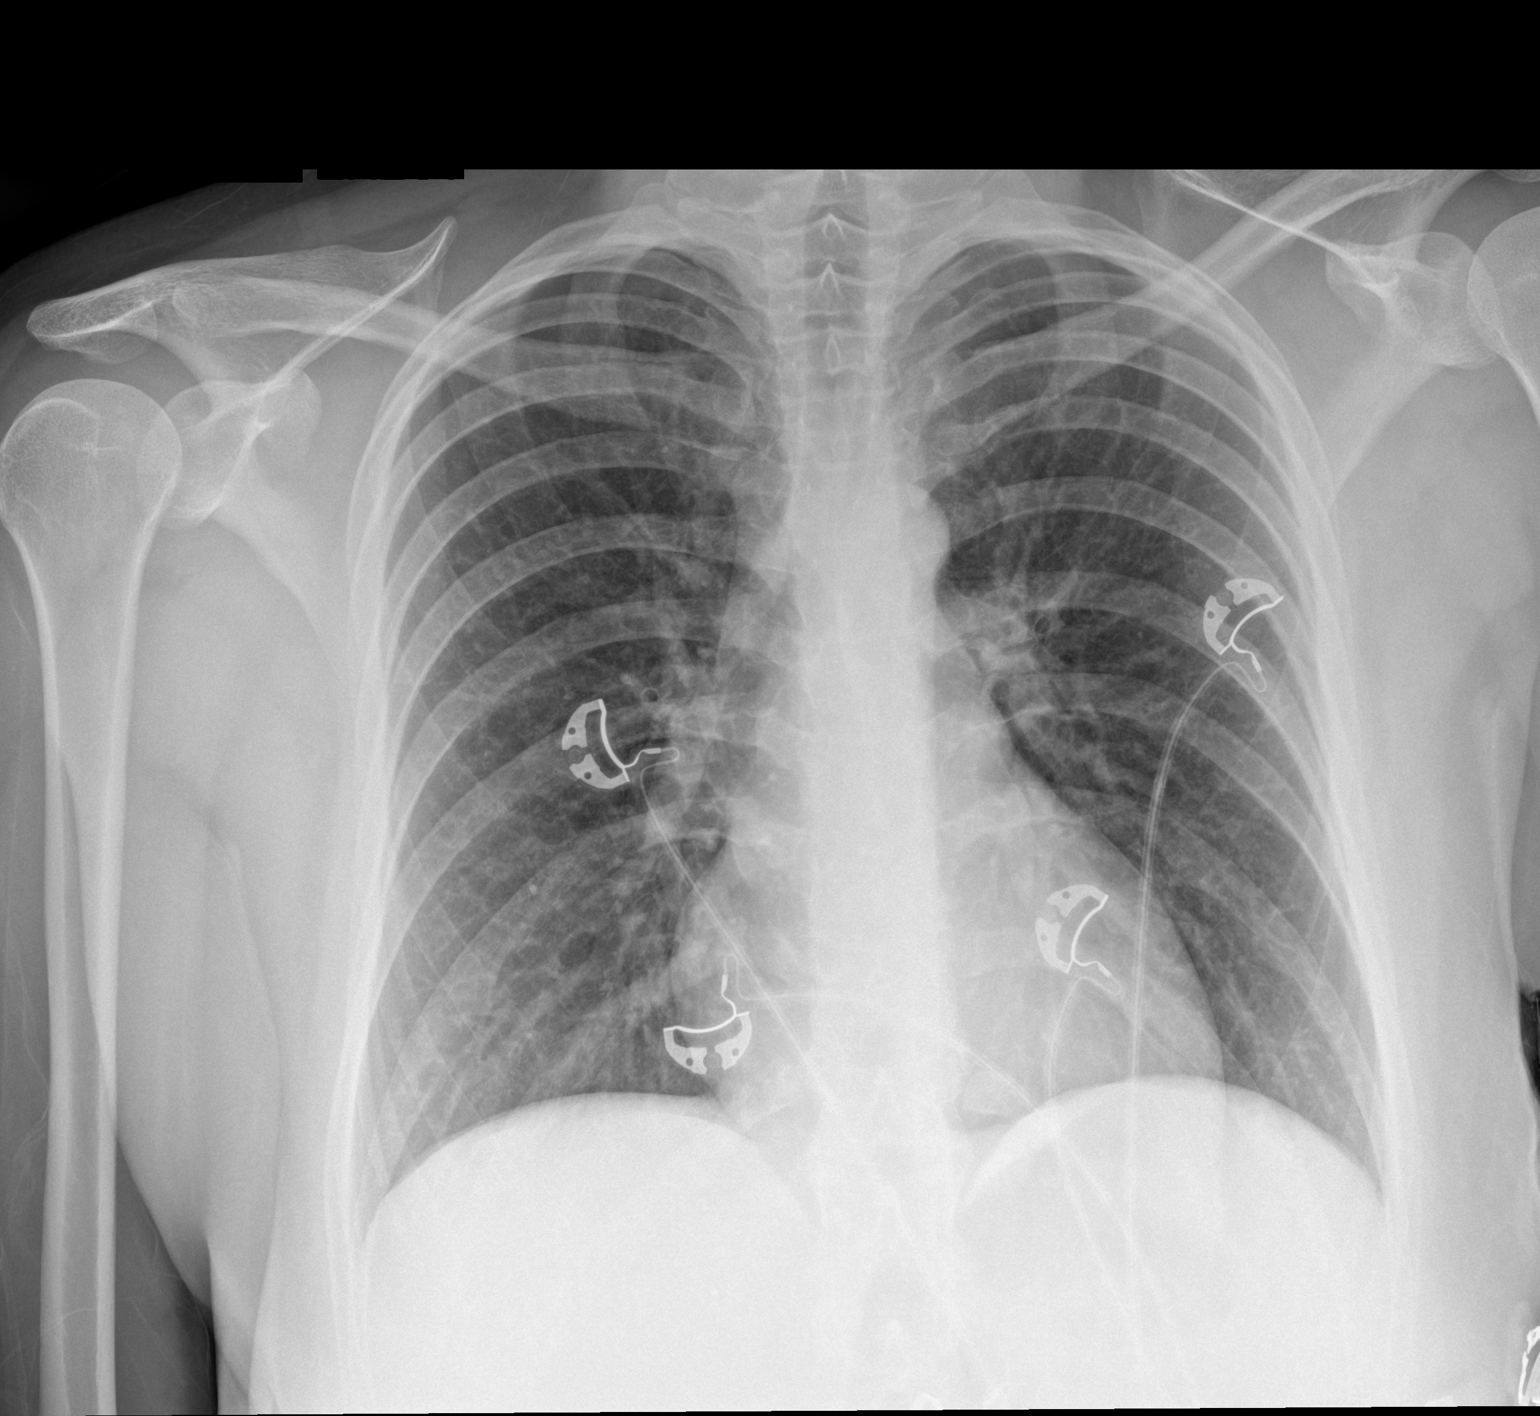

[1 of 1 positions shown; findings below may reference images not displayed]

FINDINGS: Heart and mediastinal contours are within normal limits. No focal
opacities or effusions. No acute bony abnormality.
IMPRESSION: No active disease.
